# Patient Record
Sex: Female | Born: 1970 | Race: White | Hispanic: No | Marital: Married | Smoking: Former smoker
Health system: Southern US, Community
[De-identification: ages and names within clinical notes are randomized; demographics above are authoritative.]

## PROBLEM LIST (undated history)

## (undated) DIAGNOSIS — K760 Fatty (change of) liver, not elsewhere classified: Secondary | ICD-10-CM

## (undated) DIAGNOSIS — Z9889 Other specified postprocedural states: Secondary | ICD-10-CM

## (undated) DIAGNOSIS — K219 Gastro-esophageal reflux disease without esophagitis: Secondary | ICD-10-CM

## (undated) DIAGNOSIS — E7800 Pure hypercholesterolemia, unspecified: Secondary | ICD-10-CM

## (undated) DIAGNOSIS — F411 Generalized anxiety disorder: Secondary | ICD-10-CM

## (undated) DIAGNOSIS — K449 Diaphragmatic hernia without obstruction or gangrene: Secondary | ICD-10-CM

## (undated) HISTORY — DX: Gastro-esophageal reflux disease without esophagitis: K21.9

## (undated) HISTORY — DX: Diaphragmatic hernia without obstruction or gangrene: K44.9

## (undated) HISTORY — PX: TONSILLECTOMY: SUR1361

## (undated) HISTORY — DX: Fatty (change of) liver, not elsewhere classified: K76.0

## (undated) HISTORY — DX: Generalized anxiety disorder: F41.1

## (undated) HISTORY — PX: APPENDECTOMY: SHX54

## (undated) HISTORY — DX: Pure hypercholesterolemia, unspecified: E78.00

## (undated) HISTORY — PX: OTHER SURGICAL HISTORY: SHX169

---

## 2016-07-27 ENCOUNTER — Other Ambulatory Visit: Payer: Self-pay | Admitting: Obstetrics and Gynecology

## 2016-07-27 DIAGNOSIS — Z803 Family history of malignant neoplasm of breast: Secondary | ICD-10-CM

## 2016-07-27 DIAGNOSIS — R928 Other abnormal and inconclusive findings on diagnostic imaging of breast: Secondary | ICD-10-CM

## 2016-08-07 ENCOUNTER — Other Ambulatory Visit: Payer: Self-pay

## 2017-04-23 ENCOUNTER — Encounter (INDEPENDENT_AMBULATORY_CARE_PROVIDER_SITE_OTHER): Payer: Self-pay | Admitting: Internal Medicine

## 2017-04-23 ENCOUNTER — Encounter (INDEPENDENT_AMBULATORY_CARE_PROVIDER_SITE_OTHER): Payer: Self-pay

## 2017-05-19 ENCOUNTER — Ambulatory Visit (INDEPENDENT_AMBULATORY_CARE_PROVIDER_SITE_OTHER): Payer: Self-pay | Admitting: Internal Medicine

## 2017-06-03 ENCOUNTER — Ambulatory Visit (INDEPENDENT_AMBULATORY_CARE_PROVIDER_SITE_OTHER): Payer: PRIVATE HEALTH INSURANCE | Admitting: Internal Medicine

## 2017-06-03 ENCOUNTER — Encounter (INDEPENDENT_AMBULATORY_CARE_PROVIDER_SITE_OTHER): Payer: Self-pay | Admitting: *Deleted

## 2017-06-03 ENCOUNTER — Encounter (INDEPENDENT_AMBULATORY_CARE_PROVIDER_SITE_OTHER): Payer: Self-pay | Admitting: Internal Medicine

## 2017-06-03 VITALS — BP 142/80 | HR 72 | Temp 99.0°F | Ht 65.0 in | Wt 171.7 lb

## 2017-06-03 DIAGNOSIS — K760 Fatty (change of) liver, not elsewhere classified: Secondary | ICD-10-CM | POA: Insufficient documentation

## 2017-06-03 DIAGNOSIS — F411 Generalized anxiety disorder: Secondary | ICD-10-CM

## 2017-06-03 DIAGNOSIS — K581 Irritable bowel syndrome with constipation: Secondary | ICD-10-CM

## 2017-06-03 DIAGNOSIS — E7800 Pure hypercholesterolemia, unspecified: Secondary | ICD-10-CM

## 2017-06-03 DIAGNOSIS — K449 Diaphragmatic hernia without obstruction or gangrene: Secondary | ICD-10-CM | POA: Insufficient documentation

## 2017-06-03 DIAGNOSIS — K219 Gastro-esophageal reflux disease without esophagitis: Secondary | ICD-10-CM

## 2017-06-03 HISTORY — DX: Gastro-esophageal reflux disease without esophagitis: K21.9

## 2017-06-03 HISTORY — DX: Generalized anxiety disorder: F41.1

## 2017-06-03 HISTORY — DX: Fatty (change of) liver, not elsewhere classified: K76.0

## 2017-06-03 HISTORY — DX: Pure hypercholesterolemia, unspecified: E78.00

## 2017-06-03 HISTORY — DX: Diaphragmatic hernia without obstruction or gangrene: K44.9

## 2017-06-03 LAB — COMPREHENSIVE METABOLIC PANEL
AG RATIO: 1.7 (calc) (ref 1.0–2.5)
ALKALINE PHOSPHATASE (APISO): 78 U/L (ref 33–115)
ALT: 65 U/L — ABNORMAL HIGH (ref 6–29)
AST: 51 U/L — AB (ref 10–35)
Albumin: 4.3 g/dL (ref 3.6–5.1)
BILIRUBIN TOTAL: 0.4 mg/dL (ref 0.2–1.2)
BUN: 11 mg/dL (ref 7–25)
CHLORIDE: 105 mmol/L (ref 98–110)
CO2: 27 mmol/L (ref 20–32)
Calcium: 9.2 mg/dL (ref 8.6–10.2)
Creat: 0.81 mg/dL (ref 0.50–1.10)
GLOBULIN: 2.5 g/dL (ref 1.9–3.7)
Glucose, Bld: 125 mg/dL — ABNORMAL HIGH (ref 65–99)
Potassium: 4.3 mmol/L (ref 3.5–5.3)
Sodium: 140 mmol/L (ref 135–146)
Total Protein: 6.8 g/dL (ref 6.1–8.1)

## 2017-06-03 MED ORDER — DICYCLOMINE HCL 10 MG PO CAPS: 10.0000 mg | ORAL_CAPSULE | Freq: Two times a day (BID) | ORAL | 3 refills | Status: DC

## 2017-06-03 NOTE — Progress Notes (Addendum)
   Subjective:    Patient ID: Lynn Mcdaniel, female    DOB: 1971-05-29, 46 y.o.   MRN: [id number]  HPI Referred by Marlena Clipper NP for IBS/constipaton. Hx of same. She has bloating and constipation off and on.  She has random lower abdominal pain. No foods in particular bother her. She has the pain 4-5 times a month. The pain will last a day or two off and on. She says the pain feels like there is gas that is not moving. Lying on her stomach gives her relief.  She has a BM once a day and she has to strain.  She does not take a stool softener or laxative for this.  Her appetite is good. No weight loss. She has tried Bentyl which did not help.  Hx of fatty liver.  She says Acute Hepatitis panel in the past has been negative. 02/20/2016 [place] RUQ: Rt upper quadrant pain Fatty liver, otherwise normal.  No past hx of IV drugs.  One tattoo.   Hx of blood transfusion in 1998 Her last colonoscopy was in 2015 by Dr. Aleene Davidson (Irritable Bowel) (Normal).   03/16/2017 EGD: abdominal pain, hiatal hernia, duodenitis: Normal gastric mucosa from cardia to second portion of duodenum. Good gastri peristalsis.  4cm hiatus hernia: Negative for Celiac disease.   Review of Systems Past Medical History:  Diagnosis Date  . Anxiety state 06/03/2017  . Fatty liver 06/03/2017  . GERD (gastroesophageal reflux disease) 06/03/2017  . Hiatal hernia 06/03/2017  . High cholesterol 06/03/2017    Past Surgical History:  Procedure Laterality Date  . APPENDECTOMY    . CESAREAN SECTION    . partial hysterectomy    . TONSILLECTOMY      No Known Allergies  No current outpatient prescriptions on file prior to visit.   No current facility-administered medications on file prior to visit.         Objective:   Physical Exam Blood pressure (!) 142/80, pulse 72, temperature 99 F (37.2 C), height  (1.651 m), weight 171 lb 11.2 oz (77.9 kg).  Alert and oriented. Skin warm and dry. Oral mucosa is moist.   .  Sclera anicteric, conjunctivae is pink. Thyroid not enlarged. No cervical lymphadenopathy. Lungs clear. Heart regular rate and rhythm.  Abdomen is soft. Bowel sounds are positive. No hepatomegaly. No abdominal masses felt. No tenderness.  No edema to lower extremities.         Assessment & Plan:  Constipation. Am going to try her on Amitiza  BID She will try the Bentyl she has at home.  Hx of fatty liver: Will get CMET, [place] Elastrography.  OV in 3 months.

## 2017-06-03 NOTE — Patient Instructions (Signed)
Sample of Amitiza given to patient.  Rx for Dicyclomine  OV in 3 months.

## 2017-06-16 ENCOUNTER — Ambulatory Visit (HOSPITAL_COMMUNITY)
Admission: RE | Admit: 2017-06-16 | Discharge: 2017-06-16 | Disposition: A | Discharge status: Home / Self Care | Payer: PRIVATE HEALTH INSURANCE | Source: Ambulatory Visit | Attending: Internal Medicine | Admitting: Internal Medicine

## 2017-06-16 DIAGNOSIS — K760 Fatty (change of) liver, not elsewhere classified: Secondary | ICD-10-CM | POA: Diagnosis not present

## 2018-01-25 ENCOUNTER — Other Ambulatory Visit (INDEPENDENT_AMBULATORY_CARE_PROVIDER_SITE_OTHER): Payer: Self-pay | Admitting: Internal Medicine

## 2019-08-05 IMAGING — US US ABDOMEN COMPLETE W/ ELASTOGRAPHY
2 series · 13 of 25 positions shown · non-contrast
Comparison: None.

CLINICAL DATA: Fatty liver



[Series 1: us abdomen complete w/ elastography · 0.19mm/px · 10 of 91 slices shown (1 of 2)]
[im 1/91]
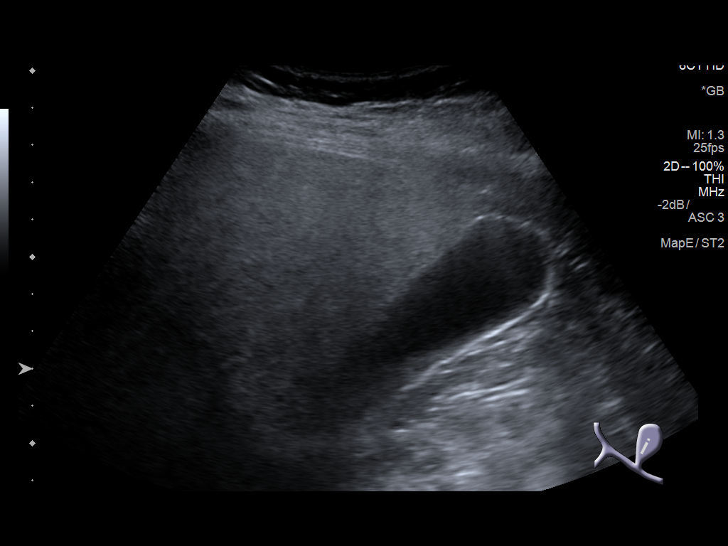
[im 10/91]
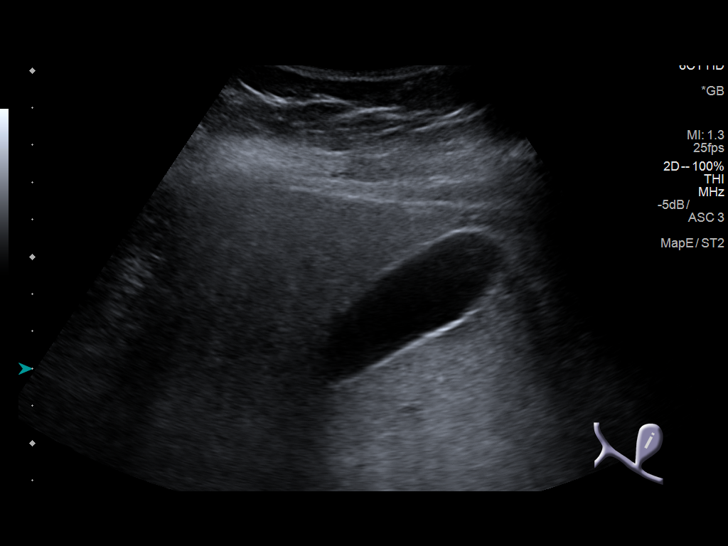
[im 19/91]
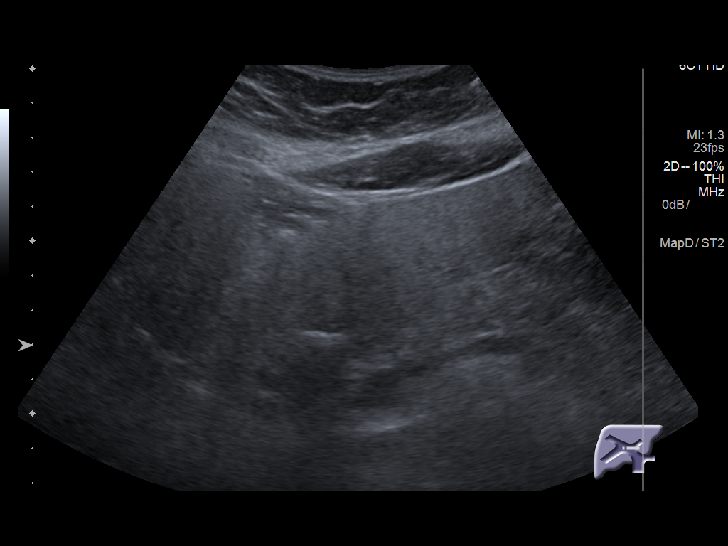
[im 29/91]
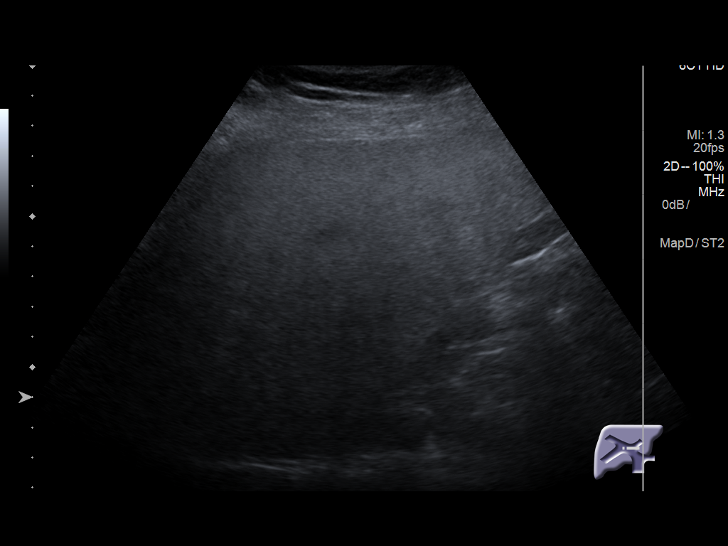
[im 38/91]
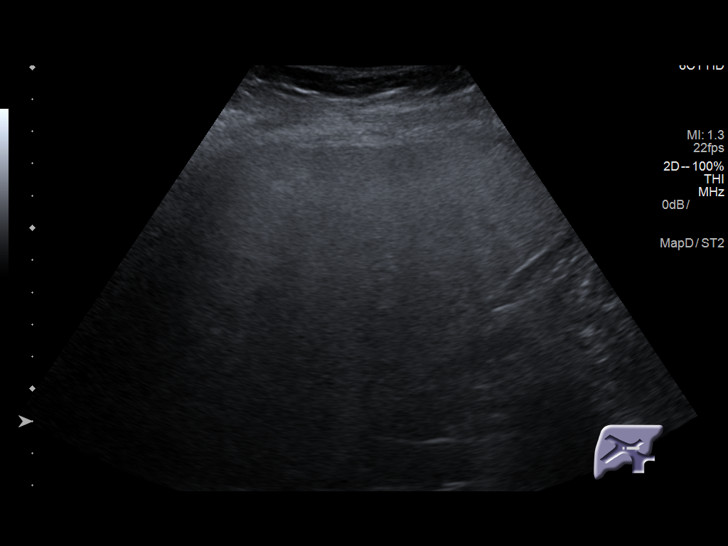
[im 48/91]
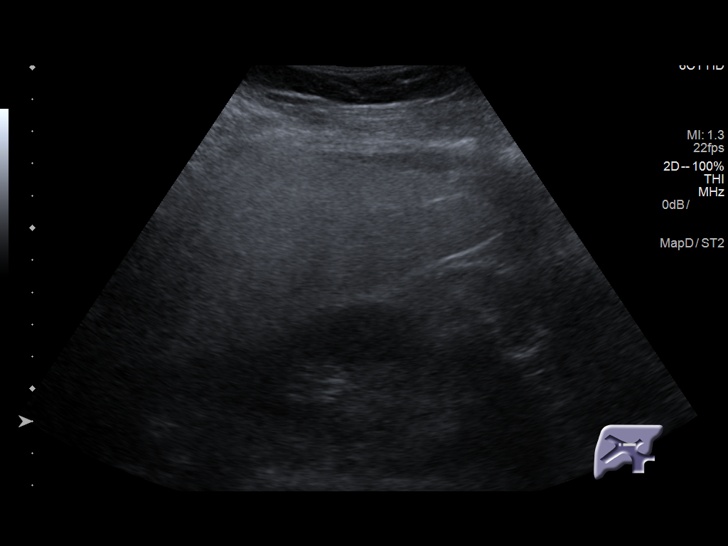
[im 57/91]
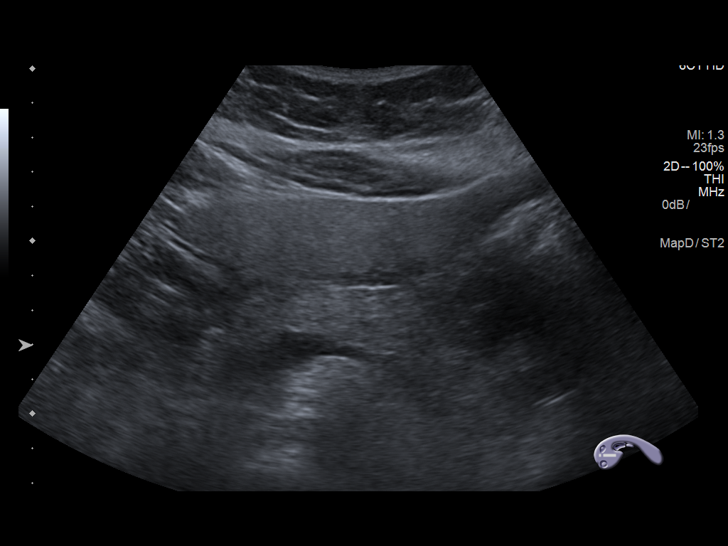
[im 67/91]
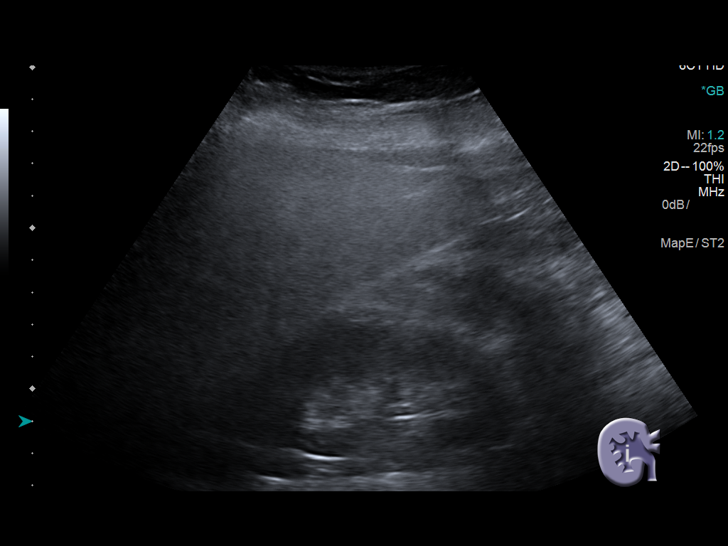
[im 76/91]
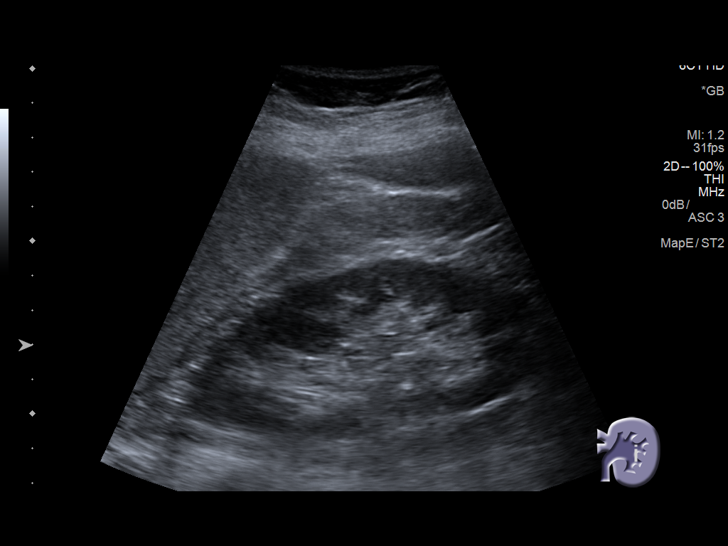
[im 86/91]
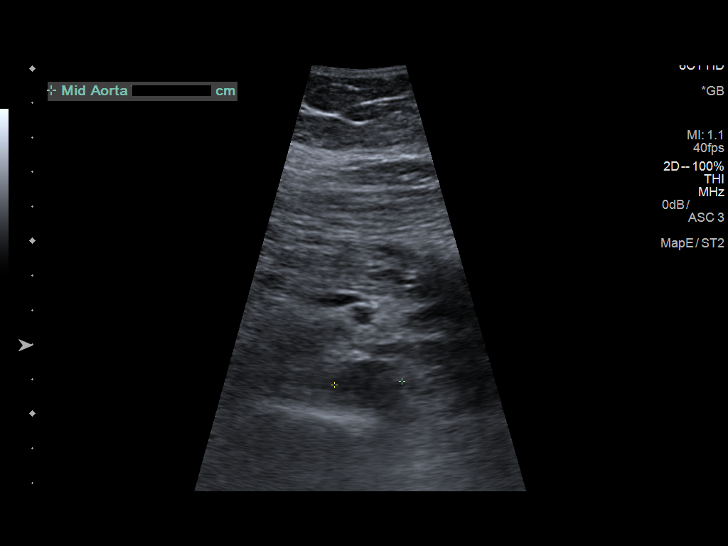

[Series 2001: us abdomen complete w/ elastography · 0.16mm/px · 3 of 20 slices shown (2 of 2)]
[im 1/20]
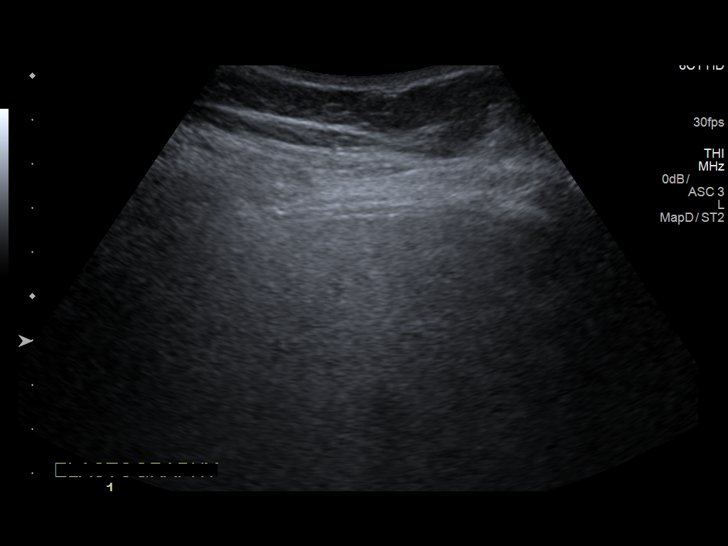
[im 10/20]
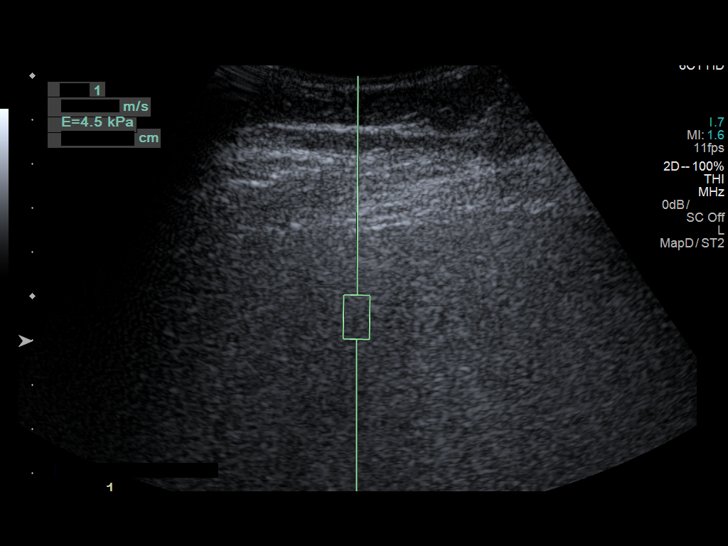
[im 20/20]
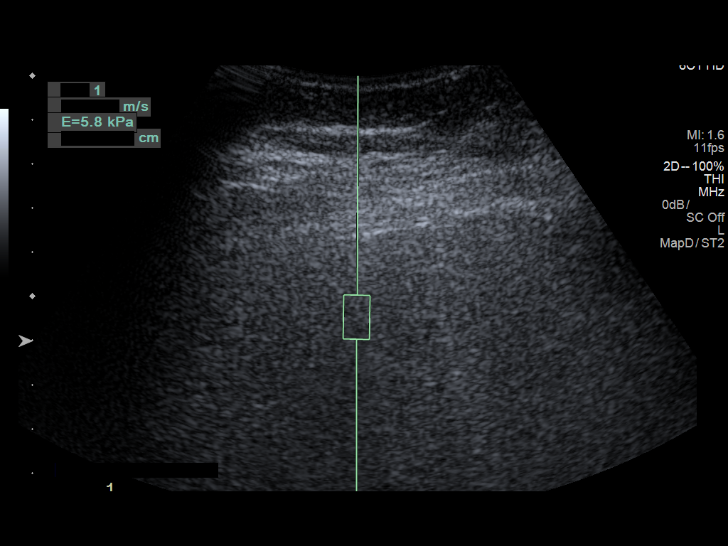

[13 of 25 positions shown; findings below may reference images not displayed]

FINDINGS: ULTRASOUND ABDOMEN

Gallbladder: No gallstones or wall thickening visualized. No
sonographic Murphy sign noted by sonographer.

Common bile duct: Diameter: Normal caliber, 3 mm

Liver: Increased echotexture compatible with fatty infiltration. No
focal abnormality or biliary ductal dilatation. Portal vein is
patent on color Doppler imaging with normal direction of blood flow
towards the liver.

IVC: No abnormality visualized.

Pancreas: Visualized portion unremarkable.

Spleen: Size and appearance within normal limits.

Right Kidney: Length: 9.3 cm. Echogenicity within normal limits. No
mass or hydronephrosis visualized.

Left Kidney: Length: 9.0 cm. Echogenicity within normal limits. No
mass or hydronephrosis visualized.

Abdominal aorta: No aneurysm visualized.

Other findings: None.

ULTRASOUND HEPATIC ELASTOGRAPHY

Device: Siemens Helix VTQ

Patient position: Left lateral decubitus

Transducer 6C1

Number of measurements: 10

Hepatic segment:  8

Median velocity:   1.14  m/sec

IQR:

IQR/Median velocity ratio:

Corresponding Metavir fibrosis score:  F0/F1

Risk of fibrosis: Minimal

Limitations of exam: None

Pertinent findings noted on other imaging exams:  None

Please note that abnormal shear wave velocities may also be
identified in clinical settings other than with hepatic fibrosis,
such as: acute hepatitis, elevated right heart and central venous
pressures including use of beta blockers, Edouard disease
(Aqu[place]), infiltrative processes such as
mastocytosis/amyloidosis/infiltrative tumor, extrahepatic
cholestasis, in the post-prandial state, and liver transplantation.
Correlation with patient history, laboratory data, and clinical
condition recommended.
IMPRESSION: ULTRASOUND ABDOMEN:
Fatty infiltration of the liver.

ULTRASOUND HEPATIC ELASTOGRAPHY:

Median hepatic shear wave velocity is calculated at 1.14 m/sec.

Corresponding Metavir fibrosis score is  F0/F1.

Risk of fibrosis is Minimal.

Follow-up: None required

## 2023-06-07 ENCOUNTER — Encounter (INDEPENDENT_AMBULATORY_CARE_PROVIDER_SITE_OTHER): Payer: Self-pay | Admitting: *Deleted

## 2023-06-14 ENCOUNTER — Ambulatory Visit (INDEPENDENT_AMBULATORY_CARE_PROVIDER_SITE_OTHER): Payer: BC Managed Care – PPO | Admitting: Gastroenterology

## 2023-06-14 ENCOUNTER — Encounter (INDEPENDENT_AMBULATORY_CARE_PROVIDER_SITE_OTHER): Payer: Self-pay | Admitting: Gastroenterology

## 2023-06-14 VITALS — BP 118/71 | HR 50 | Temp 98.1°F | Ht 66.0 in | Wt 166.0 lb

## 2023-06-14 DIAGNOSIS — R1013 Epigastric pain: Secondary | ICD-10-CM | POA: Diagnosis not present

## 2023-06-14 DIAGNOSIS — K760 Fatty (change of) liver, not elsewhere classified: Secondary | ICD-10-CM

## 2023-06-14 DIAGNOSIS — R1319 Other dysphagia: Secondary | ICD-10-CM

## 2023-06-14 DIAGNOSIS — R1310 Dysphagia, unspecified: Secondary | ICD-10-CM | POA: Diagnosis not present

## 2023-06-14 DIAGNOSIS — Z1211 Encounter for screening for malignant neoplasm of colon: Secondary | ICD-10-CM

## 2023-06-14 NOTE — Progress Notes (Signed)
Vista Lawman , M.D. Gastroenterology & Hepatology [facility] Gastroenterology [address] [place], [place] [address] Primary Care Physician: Aggie Cosier, MD [address] [place] [address]  Chief Complaint:  Right upper quadrant pain , Fatty liver disease , Colon cancer screening   History of Present Illness: Lynn Mcdaniel is a 52 y.o. female with HTM , preDM, HLD  who presents for evaluation of   Right upper quadrant pain , Fatty liver disease and Colon cancer screening   Patient reports right upper quadrant pain which is intermittent in nature, she has not noticed any aggravating or relieving factor, pain nature does not change with food neither with defecation.  Patient does report alternating bowel movements with sometimes straining.  But this does not her main issue.  Patient is concerned more about fatty liver which may lead to fibrosis and cirrhosis.  She has been opting for healthy lifestyle changes and trying to lose weight The patient denies having any nausea, vomiting, fever, chills, hematochezia, melena, hematemesis, abdominal distention, abdominal pain, diarrhea, jaundice, pruritus or weight loss.  Last EGD:10 years ago at [facility] Last Colonoscopy:10 years ago at [facility]  FHx: neg for any gastrointestinal/liver disease, no malignancies Social: neg smoking, alcohol or illicit drug use Surgical: Hysterectomy  Past Medical History: Past Medical History:  Diagnosis Date   Anxiety state 06/03/2017   Fatty liver 06/03/2017   GERD (gastroesophageal reflux disease) 06/03/2017   Hiatal hernia 06/03/2017   High cholesterol 06/03/2017   Family History:No family history on file.  Social History: Social History   Tobacco Use  Smoking Status Former   Passive exposure: Past  Smokeless Tobacco Never  Tobacco Comments   quit 10 yrs ago after smoking for 20 yrs. 1 pack a day.    Social History   Substance and Sexual  Activity  Alcohol Use No   Social History   Substance and Sexual Activity  Drug Use No    Allergies: No Known Allergies  Medications: Current Outpatient Medications  Medication Sig Dispense Refill   ALPRAZolam (XANAX) 0.5 MG tablet Take 0.5 mg by mouth at bedtime as needed for anxiety.     Calcium Carbonate-Vit D-Min (CALCIUM 1200 PO) Take by mouth.     cholecalciferol (VITAMIN D) 1000 units tablet Take 4,000 Units by mouth daily.     losartan (COZAAR) 50 MG tablet Take 50 mg by mouth daily.     nebivolol (BYSTOLIC) 5 MG tablet Take 5 mg by mouth daily.     omeprazole (PRILOSEC) 40 MG capsule Take 40 mg by mouth daily.     OVER THE COUNTER MEDICATION Fish oil 2 per day     sertraline (ZOLOFT) 100 MG tablet Take 100 mg by mouth daily.     simvastatin (ZOCOR) 40 MG tablet Take 40 mg by mouth daily.     No current facility-administered medications for this visit.    Review of Systems: GENERAL: negative for malaise, night sweats HEENT: No changes in hearing or vision, no nose bleeds or other nasal problems. NECK: Negative for lumps, goiter, pain and significant neck swelling RESPIRATORY: Negative for cough, wheezing CARDIOVASCULAR: Negative for chest pain, leg swelling, palpitations, orthopnea GI: SEE HPI MUSCULOSKELETAL: Negative for joint pain or swelling, back pain, and muscle pain. SKIN: Negative for lesions, rash HEMATOLOGY Negative for prolonged bleeding, bruising easily, and swollen nodes. ENDOCRINE: Negative for cold or heat intolerance, polyuria, polydipsia and goiter. NEURO: negative for tremor, gait imbalance, syncope and  seizures. The remainder of the review of systems is noncontributory.   Physical Exam: BP 118/71 (BP Location: Left Arm, Patient Position: Sitting, Cuff Size: Normal)   Pulse (!) 50   Temp 98.1 F (36.7 C) (Oral)   Ht 5\' 6"  (1.676 m)   Wt 166 lb (75.3 kg)   BMI 26.79 kg/m  GENERAL: The patient is AO x3, in no acute distress. HEENT: Head is  normocephalic and atraumatic. EOMI are intact. Mouth is well hydrated and without lesions. NECK: Supple. No masses LUNGS: Clear to auscultation. No presence of rhonchi/wheezing/rales. Adequate chest expansion HEART: RRR, normal s1 and s2. ABDOMEN: Soft, nontender, no guarding, no peritoneal signs, and nondistended. BS +. No masses. EXTREMITIES: Without any cyanosis, clubbing, rash, lesions or edema. NEUROLOGIC: AOx3, no focal motor deficit. SKIN: no jaundice, no rashes   Imaging/Labs: as above  ULTRASOUND ABDOMEN: Fatty infiltration of the liver.   ULTRASOUND HEPATIC ELASTOGRAPHY:   Median hepatic shear wave velocity is calculated at 1.14 m/sec.   Corresponding Metavir fibrosis score is  F0/F1.   Risk of fibrosis is Minimal.   I personally reviewed and interpreted the available labs, imaging and endoscopic files.  Impression and Plan:   Lynn Mcdaniel is a 52 y.o. female with HTM , preDM, HLD  who presents for evaluation of   Right upper quadrant pain , Fatty liver disease and Colon cancer screening   #Right upper quadrant pain  #Fatty liver disease   This could be peptic ulcer disease or symptomatic biliary/gallbladder disease or irritable bowel syndrome or sphincter of Oddi dysfunction  Fatty liver disease is usually painless in nature  Persistent right upper quadrant pain although no aggravating or relieving factor.   Abdominal exam with epigastric and right upper quadrant mild tenderness  Patient has fatty liver disease since over 5 years and's concern for progression to fibrosis or cirrhosis.  Risk factor for MASLD are BMI above 25, prediabetes, hypertension and hyperlipidemia (metabolic syndrome)  I do not see any recent liver enzyme in Care Everywhere or [employer] portal   Will proceed with obtaining a right upper quadrant sono with elastography to assess for any degree of fibrosis.  Also will order baseline viral hepatitis profile and liver  enzymes  Further recommendation for fatty liver disease:      - walking at a brisk pace/biking at moderate intesity 2.5-5 hours per week     - use pedometer/step counter to track activity     - goal to lose 5-10% of initial body weight     - avoid suagry drinks and juices, use zero calorie beverages     - increase water intake     - eat a low carb diet with plenty of veggies and fruit     - Get sufficient sleep 7-8 hrs nightly     - maitain active lifestyle     - avoid alcohol     - recommend 2-3 cups Coffee daily     - Counsel on lowering cholesterol by having a diet rich in vegetables,          protein (avoid red meats) and good fats(fish, salmon).  If upper endoscopy and above workup unremarkable patient may need HIDA scan+/- referral to surgery for possible gallbladder disease  #Dysphagia   Patient reports intermittent solid food dysphagia and with persistent abdominal pain we will ruled out peptic ulcer disease and any stricture , webs or rings with diagnostic upper endoscopy +/- esophageal biopsy/dilation  #Colon cancer  screening  Last colonoscopy 10 years ago at [facility]  The patient was counseled regarding the importance of colorectal cancer screening. The benefits of screening include early detection of colorectal cancer and precancerous polyps, which can improve treatment outcomes and reduce mortality. Risks associated with screening, particularly colonoscopy, include potential complications such as bleeding and perforation. After deciding different modalities for screening for colon cancer , patient has opted to pursue Colonoscopy   All questions were answered.      Vista Lawman, MD Gastroenterology and Hepatology [facility] Gastroenterology   This chart has been completed using [facility] Dictation software, and while attempts have been made to ensure accuracy , certain words and phrases may not be transcribed as intended

## 2023-06-14 NOTE — Patient Instructions (Signed)
It was very nice to meet you today, as dicussed with will plan for the following :  1) Upper endoscopy and colonoscopy  2) blood work and ultrasound

## 2023-06-14 NOTE — H&P (View-Only) (Signed)
Vista Lawman , M.D. Gastroenterology & Hepatology [facility] Gastroenterology [address] [place], [place] [address] Primary Care Physician: Aggie Cosier, MD [address] [place] [address]  Chief Complaint:  Right upper quadrant pain , Fatty liver disease , Colon cancer screening   History of Present Illness: Lynn Mcdaniel is a 52 y.o. female with HTM , preDM, HLD  who presents for evaluation of   Right upper quadrant pain , Fatty liver disease and Colon cancer screening   Patient reports right upper quadrant pain which is intermittent in nature, she has not noticed any aggravating or relieving factor, pain nature does not change with food neither with defecation.  Patient does report alternating bowel movements with sometimes straining.  But this does not her main issue.  Patient is concerned more about fatty liver which may lead to fibrosis and cirrhosis.  She has been opting for healthy lifestyle changes and trying to lose weight The patient denies having any nausea, vomiting, fever, chills, hematochezia, melena, hematemesis, abdominal distention, abdominal pain, diarrhea, jaundice, pruritus or weight loss.  Last EGD:10 years ago at [facility] Last Colonoscopy:10 years ago at [facility]  FHx: neg for any gastrointestinal/liver disease, no malignancies Social: neg smoking, alcohol or illicit drug use Surgical: Hysterectomy  Past Medical History: Past Medical History:  Diagnosis Date   Anxiety state 06/03/2017   Fatty liver 06/03/2017   GERD (gastroesophageal reflux disease) 06/03/2017   Hiatal hernia 06/03/2017   High cholesterol 06/03/2017   Family History:No family history on file.  Social History: Social History   Tobacco Use  Smoking Status Former   Passive exposure: Past  Smokeless Tobacco Never  Tobacco Comments   quit 10 yrs ago after smoking for 20 yrs. 1 pack a day.    Social History   Substance and Sexual  Activity  Alcohol Use No   Social History   Substance and Sexual Activity  Drug Use No    Allergies: No Known Allergies  Medications: Current Outpatient Medications  Medication Sig Dispense Refill   ALPRAZolam (XANAX) 0.5 MG tablet Take 0.5 mg by mouth at bedtime as needed for anxiety.     Calcium Carbonate-Vit D-Min (CALCIUM 1200 PO) Take by mouth.     cholecalciferol (VITAMIN D) 1000 units tablet Take 4,000 Units by mouth daily.     losartan (COZAAR) 50 MG tablet Take 50 mg by mouth daily.     nebivolol (BYSTOLIC) 5 MG tablet Take 5 mg by mouth daily.     omeprazole (PRILOSEC) 40 MG capsule Take 40 mg by mouth daily.     OVER THE COUNTER MEDICATION Fish oil 2 per day     sertraline (ZOLOFT) 100 MG tablet Take 100 mg by mouth daily.     simvastatin (ZOCOR) 40 MG tablet Take 40 mg by mouth daily.     No current facility-administered medications for this visit.    Review of Systems: GENERAL: negative for malaise, night sweats HEENT: No changes in hearing or vision, no nose bleeds or other nasal problems. NECK: Negative for lumps, goiter, pain and significant neck swelling RESPIRATORY: Negative for cough, wheezing CARDIOVASCULAR: Negative for chest pain, leg swelling, palpitations, orthopnea GI: SEE HPI MUSCULOSKELETAL: Negative for joint pain or swelling, back pain, and muscle pain. SKIN: Negative for lesions, rash HEMATOLOGY Negative for prolonged bleeding, bruising easily, and swollen nodes. ENDOCRINE: Negative for cold or heat intolerance, polyuria, polydipsia and goiter. NEURO: negative for tremor, gait imbalance, syncope and  seizures. The remainder of the review of systems is noncontributory.   Physical Exam: BP 118/71 (BP Location: Left Arm, Patient Position: Sitting, Cuff Size: Normal)   Pulse (!) 50   Temp 98.1 F (36.7 C) (Oral)   Ht 5\' 6"  (1.676 m)   Wt 166 lb (75.3 kg)   BMI 26.79 kg/m  GENERAL: The patient is AO x3, in no acute distress. HEENT: Head is  normocephalic and atraumatic. EOMI are intact. Mouth is well hydrated and without lesions. NECK: Supple. No masses LUNGS: Clear to auscultation. No presence of rhonchi/wheezing/rales. Adequate chest expansion HEART: RRR, normal s1 and s2. ABDOMEN: Soft, nontender, no guarding, no peritoneal signs, and nondistended. BS +. No masses. EXTREMITIES: Without any cyanosis, clubbing, rash, lesions or edema. NEUROLOGIC: AOx3, no focal motor deficit. SKIN: no jaundice, no rashes   Imaging/Labs: as above  ULTRASOUND ABDOMEN: Fatty infiltration of the liver.   ULTRASOUND HEPATIC ELASTOGRAPHY:   Median hepatic shear wave velocity is calculated at 1.14 m/sec.   Corresponding Metavir fibrosis score is  F0/F1.   Risk of fibrosis is Minimal.   I personally reviewed and interpreted the available labs, imaging and endoscopic files.  Impression and Plan:   Lynn Mcdaniel is a 52 y.o. female with HTM , preDM, HLD  who presents for evaluation of   Right upper quadrant pain , Fatty liver disease and Colon cancer screening   #Right upper quadrant pain  #Fatty liver disease   This could be peptic ulcer disease or symptomatic biliary/gallbladder disease or irritable bowel syndrome or sphincter of Oddi dysfunction  Fatty liver disease is usually painless in nature  Persistent right upper quadrant pain although no aggravating or relieving factor.   Abdominal exam with epigastric and right upper quadrant mild tenderness  Patient has fatty liver disease since over 5 years and's concern for progression to fibrosis or cirrhosis.  Risk factor for MASLD are BMI above 25, prediabetes, hypertension and hyperlipidemia (metabolic syndrome)  I do not see any recent liver enzyme in Care Everywhere or [employer] portal   Will proceed with obtaining a right upper quadrant sono with elastography to assess for any degree of fibrosis.  Also will order baseline viral hepatitis profile and liver  enzymes  Further recommendation for fatty liver disease:      - walking at a brisk pace/biking at moderate intesity 2.5-5 hours per week     - use pedometer/step counter to track activity     - goal to lose 5-10% of initial body weight     - avoid suagry drinks and juices, use zero calorie beverages     - increase water intake     - eat a low carb diet with plenty of veggies and fruit     - Get sufficient sleep 7-8 hrs nightly     - maitain active lifestyle     - avoid alcohol     - recommend 2-3 cups Coffee daily     - Counsel on lowering cholesterol by having a diet rich in vegetables,          protein (avoid red meats) and good fats(fish, salmon).  If upper endoscopy and above workup unremarkable patient may need HIDA scan+/- referral to surgery for possible gallbladder disease  #Dysphagia   Patient reports intermittent solid food dysphagia and with persistent abdominal pain we will ruled out peptic ulcer disease and any stricture , webs or rings with diagnostic upper endoscopy +/- esophageal biopsy/dilation  #Colon cancer  screening  Last colonoscopy 10 years ago at [facility]  The patient was counseled regarding the importance of colorectal cancer screening. The benefits of screening include early detection of colorectal cancer and precancerous polyps, which can improve treatment outcomes and reduce mortality. Risks associated with screening, particularly colonoscopy, include potential complications such as bleeding and perforation. After deciding different modalities for screening for colon cancer , patient has opted to pursue Colonoscopy   All questions were answered.      Vista Lawman, MD Gastroenterology and Hepatology [facility] Gastroenterology   This chart has been completed using [facility] Dictation software, and while attempts have been made to ensure accuracy , certain words and phrases may not be transcribed as intended

## 2023-06-15 LAB — COMPREHENSIVE METABOLIC PANEL
ALT: 22 [IU]/L (ref 0–32)
AST: 22 [IU]/L (ref 0–40)
Albumin: 4.5 g/dL (ref 3.8–4.9)
Alkaline Phosphatase: 97 [IU]/L (ref 44–121)
BUN/Creatinine Ratio: 18 (ref 9–23)
BUN: 16 mg/dL (ref 6–24)
Bilirubin Total: 0.3 mg/dL (ref 0.0–1.2)
CO2: 23 mmol/L (ref 20–29)
Calcium: 9.2 mg/dL (ref 8.7–10.2)
Chloride: 107 mmol/L — ABNORMAL HIGH (ref 96–106)
Creatinine, Ser: 0.87 mg/dL (ref 0.57–1.00)
Globulin, Total: 1.9 g/dL (ref 1.5–4.5)
Glucose: 97 mg/dL (ref 70–99)
Potassium: 3.8 mmol/L (ref 3.5–5.2)
Sodium: 144 mmol/L (ref 134–144)
Total Protein: 6.4 g/dL (ref 6.0–8.5)
eGFR: 80 mL/min/{1.73_m2} (ref >59)

## 2023-06-15 LAB — HEPATITIS B SURFACE ANTIGEN: Hepatitis B Surface Ag: NEGATIVE

## 2023-06-15 LAB — HEPATITIS C ANTIBODY: Hep C Virus Ab: NONREACTIVE

## 2023-06-15 LAB — HEPATITIS B SURFACE ANTIBODY,QUALITATIVE: Hep B Surface Ab, Qual: NONREACTIVE

## 2023-06-15 LAB — HEPATITIS B CORE ANTIBODY, TOTAL: Hep B Core Total Ab: NEGATIVE

## 2023-06-15 LAB — HEPATITIS A ANTIBODY, TOTAL: hep A Total Ab: NEGATIVE

## 2023-06-16 NOTE — Progress Notes (Signed)
Hi Toniann Fail ,  Can you please call the patient and tell the patient the lab work shows normal liver enzymes, this is a good news  Recommend vaccination against hepatitis A and B to follow-up with PCP  Also will follow-up your ultrasound  Thanks,  Vista Lawman, MD Gastroenterology and Hepatology [facility] Gastroenterology  ===============  Normal AST ALT and alk phos T. bili 0.3 Hep A nonimmune hep B nonexposed nonimmune hep C negative

## 2023-06-17 ENCOUNTER — Telehealth (INDEPENDENT_AMBULATORY_CARE_PROVIDER_SITE_OTHER): Payer: Self-pay | Admitting: Gastroenterology

## 2023-06-17 NOTE — Telephone Encounter (Signed)
Left message for pt to return call. Need to schedule TCS/EGD. [place] scheduled for 06/25/23 at 10:30 am [facility]. NPO after midnight. Arrive 15 minutes early.

## 2023-06-18 MED ORDER — PEG 3350-KCL-NA BICARB-NACL 420 G PO SOLR: 4000.0000 mL | Freq: Once | ORAL | 0 refills | Status: AC

## 2023-06-18 NOTE — Telephone Encounter (Signed)
Pt left voicemail to call her at work to schedule. Contacted pt and scheduled TCS/EGD for 07/13/23 at 10:30am. Instructions sent via my chart. Prep sent to pharmacy.

## 2023-06-18 NOTE — Addendum Note (Signed)
Addended by: Marlowe Shores on: 06/18/2023 08:38 AM   Modules accepted: Orders

## 2023-06-25 ENCOUNTER — Ambulatory Visit (HOSPITAL_COMMUNITY)
Admission: RE | Admit: 2023-06-25 | Discharge: 2023-06-25 | Disposition: A | Discharge status: Home / Self Care | Payer: BC Managed Care – PPO | Source: Ambulatory Visit | Attending: Gastroenterology | Admitting: Gastroenterology

## 2023-06-25 DIAGNOSIS — K760 Fatty (change of) liver, not elsewhere classified: Secondary | ICD-10-CM | POA: Insufficient documentation

## 2023-07-08 ENCOUNTER — Telehealth (INDEPENDENT_AMBULATORY_CARE_PROVIDER_SITE_OTHER): Payer: Self-pay | Admitting: *Deleted

## 2023-07-08 NOTE — Telephone Encounter (Signed)
Pt called to get results of ultrasound. I called her back and let her know it has not been read yet by radiologist. She had done on 10/25. I told her it was taking about 2 weeks lately to get report finalized and I would check it tomorrow.

## 2023-07-09 NOTE — Telephone Encounter (Signed)
Still pending. Pt notified on vm [place] still pending. And I would follow up on Monday.

## 2023-07-12 NOTE — Telephone Encounter (Signed)
Pt called for results of scan

## 2023-07-13 ENCOUNTER — Ambulatory Visit (HOSPITAL_COMMUNITY): Payer: BC Managed Care – PPO | Admitting: Certified Registered"

## 2023-07-13 ENCOUNTER — Encounter (HOSPITAL_COMMUNITY)
Admission: RE | Disposition: A | Discharge status: Home / Self Care | Payer: Self-pay | Source: Home / Self Care | Attending: Gastroenterology

## 2023-07-13 ENCOUNTER — Ambulatory Visit (HOSPITAL_COMMUNITY)
Admission: RE | Admit: 2023-07-13 | Discharge: 2023-07-13 | Disposition: A | Discharge status: Home / Self Care | Payer: BC Managed Care – PPO | Attending: Gastroenterology | Admitting: Gastroenterology

## 2023-07-13 ENCOUNTER — Encounter (HOSPITAL_COMMUNITY): Payer: Self-pay

## 2023-07-13 ENCOUNTER — Other Ambulatory Visit: Payer: Self-pay

## 2023-07-13 DIAGNOSIS — K760 Fatty (change of) liver, not elsewhere classified: Secondary | ICD-10-CM | POA: Insufficient documentation

## 2023-07-13 DIAGNOSIS — R1310 Dysphagia, unspecified: Secondary | ICD-10-CM | POA: Insufficient documentation

## 2023-07-13 DIAGNOSIS — Z1211 Encounter for screening for malignant neoplasm of colon: Secondary | ICD-10-CM | POA: Insufficient documentation

## 2023-07-13 DIAGNOSIS — K648 Other hemorrhoids: Secondary | ICD-10-CM | POA: Insufficient documentation

## 2023-07-13 DIAGNOSIS — K3189 Other diseases of stomach and duodenum: Secondary | ICD-10-CM | POA: Insufficient documentation

## 2023-07-13 DIAGNOSIS — K209 Esophagitis, unspecified without bleeding: Secondary | ICD-10-CM

## 2023-07-13 DIAGNOSIS — R7303 Prediabetes: Secondary | ICD-10-CM | POA: Insufficient documentation

## 2023-07-13 DIAGNOSIS — K644 Residual hemorrhoidal skin tags: Secondary | ICD-10-CM | POA: Insufficient documentation

## 2023-07-13 DIAGNOSIS — K219 Gastro-esophageal reflux disease without esophagitis: Secondary | ICD-10-CM | POA: Insufficient documentation

## 2023-07-13 DIAGNOSIS — Z87891 Personal history of nicotine dependence: Secondary | ICD-10-CM | POA: Insufficient documentation

## 2023-07-13 DIAGNOSIS — F419 Anxiety disorder, unspecified: Secondary | ICD-10-CM | POA: Insufficient documentation

## 2023-07-13 DIAGNOSIS — K317 Polyp of stomach and duodenum: Secondary | ICD-10-CM | POA: Diagnosis not present

## 2023-07-13 DIAGNOSIS — R1013 Epigastric pain: Secondary | ICD-10-CM

## 2023-07-13 DIAGNOSIS — K2970 Gastritis, unspecified, without bleeding: Secondary | ICD-10-CM

## 2023-07-13 HISTORY — PX: ESOPHAGOGASTRODUODENOSCOPY (EGD) WITH PROPOFOL: SHX5813

## 2023-07-13 HISTORY — PX: POLYPECTOMY: SHX5525

## 2023-07-13 HISTORY — PX: BIOPSY: SHX5522

## 2023-07-13 HISTORY — DX: Other specified postprocedural states: Z98.890

## 2023-07-13 HISTORY — PX: HEMOSTASIS CLIP PLACEMENT: SHX6857

## 2023-07-13 HISTORY — PX: COLONOSCOPY WITH PROPOFOL: SHX5780

## 2023-07-13 SURGERY — COLONOSCOPY WITH PROPOFOL
Anesthesia: General

## 2023-07-13 MED ORDER — PHENYLEPHRINE 80 MCG/ML (10ML) SYRINGE FOR IV PUSH (FOR BLOOD PRESSURE SUPPORT)
PREFILLED_SYRINGE | INTRAVENOUS | Status: DC | PRN
  Administered 2023-07-13: 160 ug via INTRAVENOUS
  Administered 2023-07-13: 80 ug via INTRAVENOUS
  Administered 2023-07-13: 160 ug via INTRAVENOUS

## 2023-07-13 MED ORDER — GLYCOPYRROLATE PF 0.2 MG/ML IJ SOSY
PREFILLED_SYRINGE | INTRAMUSCULAR | Status: DC | PRN
Start: 2023-07-13 — End: 2023-07-13
  Administered 2023-07-13: .2 mg via INTRAVENOUS

## 2023-07-13 MED ORDER — PROPOFOL 500 MG/50ML IV EMUL
INTRAVENOUS | Status: DC | PRN
  Administered 2023-07-13: 150 ug/kg/min via INTRAVENOUS

## 2023-07-13 MED ORDER — GLYCOPYRROLATE PF 0.2 MG/ML IJ SOSY
PREFILLED_SYRINGE | INTRAMUSCULAR | Status: AC
  Filled 2023-07-13: qty 1

## 2023-07-13 MED ORDER — LIDOCAINE HCL (CARDIAC) PF 100 MG/5ML IV SOSY
PREFILLED_SYRINGE | INTRAVENOUS | Status: DC | PRN
  Administered 2023-07-13: 50 mg via INTRAVENOUS

## 2023-07-13 MED ORDER — STERILE WATER FOR IRRIGATION IR SOLN
Status: DC | PRN
  Administered 2023-07-13: 50 mL

## 2023-07-13 MED ORDER — LACTATED RINGERS IV SOLN: INTRAVENOUS | Status: DC | PRN

## 2023-07-13 MED ORDER — PHENYLEPHRINE 80 MCG/ML (10ML) SYRINGE FOR IV PUSH (FOR BLOOD PRESSURE SUPPORT)
PREFILLED_SYRINGE | INTRAVENOUS | Status: AC
  Filled 2023-07-13: qty 10

## 2023-07-13 MED ORDER — PROPOFOL 10 MG/ML IV BOLUS
INTRAVENOUS | Status: DC | PRN
  Administered 2023-07-13: 50 mg via INTRAVENOUS
  Administered 2023-07-13: 100 mg via INTRAVENOUS
  Administered 2023-07-13: 50 mg via INTRAVENOUS

## 2023-07-13 NOTE — Anesthesia Preprocedure Evaluation (Signed)
Anesthesia Evaluation  Patient identified by MRN, date of birth, ID band Patient awake    Reviewed: Allergy & Precautions, H&P , NPO status , Patient's Chart, lab work & pertinent test results, reviewed documented beta blocker date and time   History of Anesthesia Complications (+) PONV and history of anesthetic complications  Airway Mallampati: II  TM Distance: >3 FB Neck ROM: full    Dental no notable dental hx. (+) Dental Advisory Given, Teeth Intact   Pulmonary neg pulmonary ROS, former smoker   Pulmonary exam normal breath sounds clear to auscultation       Cardiovascular Exercise Tolerance: Good negative cardio ROS Normal cardiovascular exam Rhythm:regular Rate:Normal     Neuro/Psych   Anxiety     negative neurological ROS  negative psych ROS   GI/Hepatic hiatal hernia,GERD  ,,Fatty liver   Endo/Other  negative endocrine ROS    Renal/GU negative Renal ROS  negative genitourinary   Musculoskeletal   Abdominal   Peds  Hematology negative hematology ROS (+)   Anesthesia Other Findings   Reproductive/Obstetrics negative OB ROS                             Anesthesia Physical Anesthesia Plan  ASA: 2  Anesthesia Plan: General   Post-op Pain Management: Minimal or no pain anticipated   Induction: Intravenous  PONV Risk Score and Plan: Propofol infusion  Airway Management Planned: Nasal Cannula and Natural Airway  Additional Equipment: None  Intra-op Plan:   Post-operative Plan:   Informed Consent: I have reviewed the patients History and Physical, chart, labs and discussed the procedure including the risks, benefits and alternatives for the proposed anesthesia with the patient or authorized representative who has indicated his/her understanding and acceptance.     Dental Advisory Given  Plan Discussed with: CRNA  Anesthesia Plan Comments:        Anesthesia Quick  Evaluation

## 2023-07-13 NOTE — Anesthesia Postprocedure Evaluation (Signed)
Anesthesia Post Note  Patient: Lynn Mcdaniel  Procedure(s) Performed: COLONOSCOPY WITH PROPOFOL ESOPHAGOGASTRODUODENOSCOPY (EGD) WITH PROPOFOL BIOPSY POLYPECTOMY  Patient location during evaluation: PACU Anesthesia Type: General Level of consciousness: awake and alert Pain management: pain level controlled Vital Signs Assessment: post-procedure vital signs reviewed and stable Respiratory status: spontaneous breathing, nonlabored ventilation, respiratory function stable and patient connected to nasal cannula oxygen Cardiovascular status: blood pressure returned to baseline and stable Postop Assessment: no apparent nausea or vomiting Anesthetic complications: no   There were no known notable events for this encounter.   Last Vitals:  Vitals:   07/13/23 0925 07/13/23 1020  BP: (!) 156/69 (!) 95/40  Pulse: (!) 53 (!) 59  Resp: 17 15  Temp: 36.8 C (!) 36.4 C  SpO2: 96% 97%    Last Pain:  Vitals:   07/13/23 1021  TempSrc:   PainSc: 0-No pain                 Chrystian Cupples L Armetta Henri

## 2023-07-13 NOTE — Interval H&P Note (Signed)
History and Physical Interval Note:  07/13/2023 9:17 AM  Lynn Mcdaniel  has presented today for surgery, with the diagnosis of SCREENING, DYSPHAGIA.  The various methods of treatment have been discussed with the patient and family. After consideration of risks, benefits and other options for treatment, the patient has consented to  Procedure(s) with comments: COLONOSCOPY WITH PROPOFOL (N/A) - 10:30AM;ASA 1-2 ESOPHAGOGASTRODUODENOSCOPY (EGD) WITH PROPOFOL (N/A) - 10:30AM;ASA 1-2 as a surgical intervention.  The patient's history has been reviewed, patient examined, no change in status, stable for surgery.  I have reviewed the patient's chart and labs.  Questions were answered to the patient's satisfaction.     Juanetta Beets Ardell Makarewicz

## 2023-07-13 NOTE — Progress Notes (Signed)
Spoke to the patient in person  Suspected hepatic steatosis.  5 mm nonobstructing interpolar right renal calculus. No hydronephrosis.  ULTRASOUND HEPATIC ELASTOGRAPHY: Median kPa:  4.6  CBD: 3mm , No gallstones and no significant fibrosis reported   Follow up with PCP /urology for renal stone

## 2023-07-13 NOTE — Transfer of Care (Signed)
Immediate Anesthesia Transfer of Care Note  Patient: Lynn Mcdaniel  Procedure(s) Performed: COLONOSCOPY WITH PROPOFOL ESOPHAGOGASTRODUODENOSCOPY (EGD) WITH PROPOFOL BIOPSY POLYPECTOMY  Patient Location: Endoscopy Unit  Anesthesia Type:General  Level of Consciousness: drowsy  Airway & Oxygen Therapy: Patient Spontanous Breathing  Post-op Assessment: Report given to RN and Post -op Vital signs reviewed and stable  Post vital signs: Reviewed and stable  Last Vitals:  Vitals Value Taken Time  BP    Temp    Pulse    Resp    SpO2      Last Pain:  Vitals:   07/13/23 0937  TempSrc:   PainSc: 0-No pain      Patients Stated Pain Goal: 6 (07/13/23 0925)  Complications: No notable events documented.

## 2023-07-13 NOTE — Op Note (Signed)
[facility] Patient Name: Lynn Mcdaniel Procedure Date: 07/13/2023 8:52 AM MRN: [id number] Date of Birth: 16-May-1971 Attending MD: Sanjuan Dame , MD, [id number] CSN: [id number] Age: 52 Admit Type: Outpatient Procedure:                Upper GI endoscopy Indications:              Epigastric abdominal pain, Dysphagia Providers:                Sanjuan Dame, MD, Buel Ream. Thomasena Edis RN, RN,                            Francoise Ceo RN, RN, Dyann Ruddle Referring MD:             Sanjuan Dame, MD Medicines:                Monitored Anesthesia Care Complications:            No immediate complications. Estimated Blood Loss:     Estimated blood loss was minimal. Procedure:                Pre-Anesthesia Assessment:                           - Prior to the procedure, a History and Physical                            was performed, and patient medications and                            allergies were reviewed. The patient's tolerance of                            previous anesthesia was also reviewed. The risks                            and benefits of the procedure and the sedation                            options and risks were discussed with the patient.                            All questions were answered, and informed consent                            was obtained. Prior Anticoagulants: The patient has                            taken no anticoagulant or antiplatelet agents                            except for aspirin. ASA Grade Assessment: II - A                            patient with mild systemic disease. After reviewing  the risks and benefits, the patient was deemed in                            satisfactory condition to undergo the procedure.                           After obtaining informed consent, the endoscope was                            passed under direct vision. Throughout the                            procedure, the patient's blood  pressure, pulse, and                            oxygen saturations were monitored continuously. The                            [id number]-[id number] ([id number]) scope was introduced through the                            mouth, and advanced to the second part of duodenum.                            The upper GI endoscopy was accomplished with ease.                            The patient tolerated the procedure well. Scope In: 9:38:23 AM Scope Out: 9:55:20 AM Total Procedure Duration: 0 hours 16 minutes 57 seconds  Findings:      Esophagogastric landmarks were identified: the Z-line was found at 35 cm       and the gastroesophageal junction was found at 37 cm from the incisors.      No endoscopic abnormality was evident in the esophagus to explain the       patient's complaint of dysphagia. It was decided, however, to proceed       with dilation of the entire esophagus. A guidewire was placed and the       scope was withdrawn. Dilation was performed with a Savary dilator with       no resistance at 16 mm. The dilation site was examined following       endoscope reinsertion and showed mild mucosal disruption. Biopsies were       obtained from the proximal and distal esophagus with cold forceps for       histology of suspected eosinophilic esophagitis.      Multiple 2 to 15 mm sessile polyps with no bleeding and no stigmata of       recent bleeding were found in the entire examined stomach. The polyp was       removed with a cold snare. Resection and retrieval were complete. For       hemostasis, one hemostatic clip was successfully placed (MR       conditional). Clip manufacturer: [employer]. There was no       bleeding at the end of the procedure.      Mildly erythematous mucosa without bleeding was  found in the gastric       antrum. Biopsies were taken with a cold forceps for histology.      The duodenal bulb and second portion of the duodenum were normal.       Biopsies were taken with a cold  forceps for histology. Impression:               - Esophagogastric landmarks identified.                           - No endoscopic esophageal abnormality to explain                            patient's dysphagia. Esophagus dilated. Dilated.                           - Multiple gastric polyps. 15mm polyp Resected and                            retrieved. Clip (MR conditional) was placed. Clip                            manufacturer: [employer].                           - Erythematous mucosa in the antrum. Biopsied.                           - Normal duodenal bulb and second portion of the                            duodenum. Biopsied.                           - Biopsies were taken with a cold forceps for                            evaluation of eosinophilic esophagitis. Moderate Sedation:      Per Anesthesia Care Recommendation:           - Patient has a contact number available for                            emergencies. The signs and symptoms of potential                            delayed complications were discussed with the                            patient. Return to normal activities tomorrow.                            Written discharge instructions were provided to the                            patient.                           -  Resume previous diet.                           - Continue present medications.                           - Await pathology results. Procedure Code(s):        --- Professional ---                           [phone], Esophagogastroduodenoscopy, flexible,                            transoral; with removal of tumor(s), polyp(s), or                            other lesion(s) by snare technique                           43248, Esophagogastroduodenoscopy, flexible,                            transoral; with insertion of guide wire followed by                            passage of dilator(s) through esophagus over guide                             wire                           43239, 59, Esophagogastroduodenoscopy, flexible,                            transoral; with biopsy, single or multiple Diagnosis Code(s):        --- Professional ---                           R13.10, Dysphagia, unspecified                           K31.7, Polyp of stomach and duodenum                           K31.89, Other diseases of stomach and duodenum                           R10.13, Epigastric pain CPT copyright 2022 American Medical Association. All rights reserved. The codes documented in this report are preliminary and upon coder review may  be revised to meet current compliance requirements. Sanjuan Dame, MD Sanjuan Dame, MD 07/13/2023 10:20:38 AM This report has been signed electronically. Number of Addenda: 0

## 2023-07-13 NOTE — Telephone Encounter (Signed)
Spoke to the patient in person today

## 2023-07-13 NOTE — Discharge Instructions (Signed)

## 2023-07-13 NOTE — Op Note (Signed)
[facility] Patient Name: Lynn Mcdaniel Procedure Date: 07/13/2023 8:50 AM MRN: [id number] Date of Birth: 1970-09-26 Attending MD: Sanjuan Dame , MD, [id number] CSN: [id number] Age: 52 Admit Type: Outpatient Procedure:                Colonoscopy Indications:              Screening for colorectal malignant neoplasm Providers:                Sanjuan Dame, MD, Buel Ream. Thomasena Edis RN, RN,                            Francoise Ceo RN, RN, Dyann Ruddle Referring MD:             Sanjuan Dame, MD Medicines:                Monitored Anesthesia Care Complications:            No immediate complications. Estimated Blood Loss:     Estimated blood loss: none. Procedure:                Pre-Anesthesia Assessment:                           - Prior to the procedure, a History and Physical                            was performed, and patient medications and                            allergies were reviewed. The patient's tolerance of                            previous anesthesia was also reviewed. The risks                            and benefits of the procedure and the sedation                            options and risks were discussed with the patient.                            All questions were answered, and informed consent                            was obtained. Prior Anticoagulants: The patient has                            taken no anticoagulant or antiplatelet agents. ASA                            Grade Assessment: II - A patient with mild systemic                            disease. After reviewing the risks and benefits,  the patient was deemed in satisfactory condition to                            undergo the procedure.                           After obtaining informed consent, the colonoscope                            was passed under direct vision. Throughout the                            procedure, the patient's blood pressure, pulse, and                             oxygen saturations were monitored continuously. The                            [id number]-[id number] ([id number]) scope was introduced through                            the anus and advanced to the the cecum, identified                            by appendiceal orifice and ileocecal valve. The                            colonoscopy was performed without difficulty. The                            patient tolerated the procedure well. The quality                            of the bowel preparation was evaluated using the                            BBPS [facility] Bowel Preparation Scale) with scores                            of: Right Colon = 3, Transverse Colon = 3 and Left                            Colon = 3 (entire mucosa seen well with no residual                            staining, small fragments of stool or opaque                            liquid). The total BBPS score equals 9. The                            ileocecal valve, appendiceal orifice, and rectum  were photographed. Scope In: 10:00:35 AM Scope Out: 10:15:51 AM Scope Withdrawal Time: 0 hours 9 minutes 59 seconds  Total Procedure Duration: 0 hours 15 minutes 16 seconds  Findings:      The perianal and digital rectal examinations were normal.      Non-bleeding external and internal hemorrhoids were found during       retroflexion. The hemorrhoids were small.      The exam was otherwise without abnormality. Impression:               - Non-bleeding external and internal hemorrhoids.                           - The examination was otherwise normal.                           - No specimens collected. Moderate Sedation:      Per Anesthesia Care Recommendation:           - Patient has a contact number available for                            emergencies. The signs and symptoms of potential                            delayed complications were discussed with the                             patient. Return to normal activities tomorrow.                            Written discharge instructions were provided to the                            patient.                           - Resume previous diet.                           - Continue present medications.                           - Repeat colonoscopy in 10 years for screening                            purposes.                           - Return to GI office as previously scheduled. Procedure Code(s):        --- Professional ---                           U4403, Colorectal cancer screening; colonoscopy on                            individual not meeting criteria for high risk Diagnosis Code(s):        --- Professional ---  Z12.11, Encounter for screening for malignant                            neoplasm of colon                           K64.8, Other hemorrhoids CPT copyright 2022 American Medical Association. All rights reserved. The codes documented in this report are preliminary and upon coder review may  be revised to meet current compliance requirements. Sanjuan Dame, MD Sanjuan Dame, MD 07/13/2023 10:22:57 AM This report has been signed electronically. Number of Addenda: 0

## 2023-07-13 NOTE — Anesthesia Procedure Notes (Signed)
Date/Time: 07/13/2023 9:39 AM  Performed by: Julian Reil, CRNAPre-anesthesia Checklist: Patient identified, Emergency Drugs available, Suction available and Patient being monitored Patient Re-evaluated:Patient Re-evaluated prior to induction Oxygen Delivery Method: Nasal cannula Induction Type: IV induction Placement Confirmation: positive ETCO2

## 2023-07-14 ENCOUNTER — Telehealth (INDEPENDENT_AMBULATORY_CARE_PROVIDER_SITE_OTHER): Payer: Self-pay | Admitting: Gastroenterology

## 2023-07-14 NOTE — Telephone Encounter (Signed)
Yes please she can have a note for yesterday

## 2023-07-14 NOTE — Telephone Encounter (Signed)
Pt left voicemail needing a work note stating she had a colonoscopy yesterday. Can we get this faxed to patient? Fax number [phone]. Please advise. Thank you

## 2023-07-14 NOTE — Telephone Encounter (Signed)
Please provide note for pt

## 2023-07-15 ENCOUNTER — Encounter (INDEPENDENT_AMBULATORY_CARE_PROVIDER_SITE_OTHER): Payer: Self-pay | Admitting: Gastroenterology

## 2023-07-15 LAB — SURGICAL PATHOLOGY

## 2023-07-16 NOTE — Progress Notes (Signed)
I reviewed the pathology results. Ann, can you send her a letter with the findings as described below please? Repeat colonoscopy in 10 years  Thanks,  Vista Lawman, MD Gastroenterology and Hepatology [facility]  ---------------------------------------------------------------------------------------------  [facility] [address], [address], [place], [place] [address] Phone:  [phone]   07/16/23 [place], [place]   Dear Lynn Mcdaniel,  I am writing to inform you that the biopsies taken during your recent endoscopic examination showed:  No H. Pylori bacteria in stomach , or any early cancer changes to the stomach mucosa ( Intestinal metaplasia)  Normal biopsies of the food-pipe ( no eosinophilic esophagitis )  Normal biopsy of small bowel The polyp we removed in the stomach is fundic gland polyp which is benign and nothing to be worried about  At this time I recommend repeat colonoscopy in 10 years  Please call us at [phone] if you have persistent problems or have questions about your condition that have not been fully answered at this time.  Sincerely,  Vista Lawman, MD Gastroenterology and Hepatology

## 2023-07-20 ENCOUNTER — Encounter (HOSPITAL_COMMUNITY): Payer: Self-pay | Admitting: Gastroenterology

## 2023-07-20 ENCOUNTER — Encounter (INDEPENDENT_AMBULATORY_CARE_PROVIDER_SITE_OTHER): Payer: Self-pay | Admitting: *Deleted

## 2024-01-31 ENCOUNTER — Encounter (INDEPENDENT_AMBULATORY_CARE_PROVIDER_SITE_OTHER): Payer: Self-pay | Admitting: Gastroenterology

## 2024-01-31 ENCOUNTER — Ambulatory Visit (INDEPENDENT_AMBULATORY_CARE_PROVIDER_SITE_OTHER): Admitting: Gastroenterology

## 2024-01-31 VITALS — BP 122/72 | HR 45 | Temp 98.1°F | Ht 64.0 in | Wt 165.4 lb

## 2024-01-31 DIAGNOSIS — K219 Gastro-esophageal reflux disease without esophagitis: Secondary | ICD-10-CM | POA: Diagnosis not present

## 2024-01-31 DIAGNOSIS — R197 Diarrhea, unspecified: Secondary | ICD-10-CM | POA: Insufficient documentation

## 2024-01-31 NOTE — Patient Instructions (Signed)
 You can continue pepcid 20mg  daily for your reflux I suspect diarrhea may be secondary to stopping your omeprazole If you have return of diarrhea, please let me know and we can do some further testing Continue probiotic/daily yogurt You may consider starting benefiber 1-2 Tablespoons per day as this can help with both constipation and diarrhea Make sure water  intake is good, diet is high in fruits, veggies, whole grains  Follow up 6 months

## 2024-01-31 NOTE — Progress Notes (Signed)
 Referring Provider: Tressie Fryer, MD Primary Care Physician:  Tressie Fryer, MD Primary GI Physician: Dr. Alita Irwin   Chief Complaint  Patient presents with   Diarrhea    Pt arrives due to having diarrhea for about 4 weeks. Diarrhea was not everyday-had it at least 4 days a week. Pt is now having constipation. Pt was diagnosed with IBS several years but has not had it with diarrhea before. Pt has bloating and pain when pushing on stomach. No nausea/vomiting/fever. Pt states not sure if Omeprazole caused a part in the diarrhea.    HPI:   Lynn Mcdaniel is a 53 y.o. female with past medical history of HTN, HLD  Patient presenting today for:  Diarrhea GERD  Last seen October 2024, at that time having RUQ pain, alternating BMs, sometimes straining. Concerned about fatty liver  Recommended EGD, Colonoscopy, consider HIDA if RUQ pain persists   Present:  States she stopped taking omeprazole in march because she was concerned about side effects and had been on this for about 15 years. She notes about 3 weeks after she stopped PPI, she began pepcid and then started having diarrhea for about 4 weeks. She stopped the pepcid for a few weeks thinking it may have caused diarrhea. She resumed this after resolution of diarrhea. She would have painless diarrhea with urgency, water  stools, upwards of 6-7 stools per day. She started taking imodium after she had a few BMs which would help usually into the next day then she would repeat Imodium dose. She notes she then went a week without a BM and then had a very large, hard, stool. She had more normal stools following that until last Tuesday when she had return of diarrhea, took imodium and did not have a BM until Saturday when stools were hard.  She notes one day last week she felt bloated with some abdominal discomfort. Denies rectal bleeding or melena. Reports she kept a stool/food diary but could not pinpoint any specific factors that seemed to affect  her stooling.  At baseline, she reports mostly regular stooling for the past 6 months to 1 year,  though does report history of IBS with constipation. She did start some probiotic yogurt which she thinks has helped some.    Elastography; 05/2024 kpa 4.6, 5mm non obstructing renal stone, no gallstones  Last Colonoscopy:07/2023 - Non-bleeding external and internal hemorrhoids.                           - The examination was otherwise normal.                           - No specimens collected. Last Endoscopy:07/2023  Esophagogastric landmarks identified.                           - No endoscopic esophageal abnormality to explain                            patient's dysphagia. Esophagus dilated. Dilated.                           - Multiple gastric polyps. 15mm polyp Resected and  retrieved. Clip (MR conditional) was placed. Clip                            manufacturer: [employer].                           - Erythematous mucosa in the antrum. Biopsied.                           - Normal duodenal bulb and second portion of the                            duodenum. Biopsied.                           - Biopsies were taken with a cold forceps for                            evaluation of eosinophilic esophagitis.  A. Benign small bowel mucosa with no significant pathologic changes   B. STOMACH, BIOPSY:  - Mild reactive gastropathy.  - Negative for H. pylori on HE stain  - No intestinal metaplasia, dysplasia, or malignancy.   C. STOMACH, POLYPECTOMY:  - Fundic gland polyps   D. ESOPHAGUS, BIOPSY:  - Benign squamous mucosa and gastric cardiac mucosa with mild chronic  inflammation  - Negative for increased intraepithelial eosinophils  - Negative for intestinal metaplasia, dysplasia or malignancy   TCS 10 year recall  [facility] Weights   01/31/24 0845  Weight: 165 lb 6.4 oz (75 kg)     Past Medical History:  Diagnosis Date   Anxiety state 06/03/2017    Fatty liver 06/03/2017   GERD (gastroesophageal reflux disease) 06/03/2017   Hiatal hernia 06/03/2017   High cholesterol 06/03/2017   PONV (postoperative nausea and vomiting)     Past Surgical History:  Procedure Laterality Date   APPENDECTOMY     BIOPSY  07/13/2023   Procedure: BIOPSY;  Surgeon: Hargis Lias, MD;  [facility]: [facility];  Service: Endoscopy;;   CESAREAN SECTION     COLONOSCOPY WITH PROPOFOL  N/A 07/13/2023   Procedure: COLONOSCOPY WITH PROPOFOL ;  Surgeon: Hargis Lias, MD;  Location: [facility];  Service: Endoscopy;  Laterality: N/A;  10:30AM;ASA 1-2   ESOPHAGOGASTRODUODENOSCOPY (EGD) WITH PROPOFOL  N/A 07/13/2023   Procedure: ESOPHAGOGASTRODUODENOSCOPY (EGD) WITH PROPOFOL ;  Surgeon: Hargis Lias, MD;  Location: [facility];  Service: Endoscopy;  Laterality: N/A;  10:30AM;ASA 1-2   HEMOSTASIS CLIP PLACEMENT  07/13/2023   Procedure: HEMOSTASIS CLIP PLACEMENT;  Surgeon: Hargis Lias, MD;  [facility]: [facility];  Service: Endoscopy;;   partial hysterectomy     POLYPECTOMY  07/13/2023   Procedure: POLYPECTOMY;  Surgeon: Hargis Lias, MD;  [facility]: [facility];  Service: Endoscopy;;   TONSILLECTOMY      Current Outpatient Medications  Medication Sig Dispense Refill   ALPRAZolam (XANAX) 0.5 MG tablet Take 0.5 mg by mouth at bedtime as needed for anxiety.     Calcium Carbonate-Vit D-Min (CALCIUM 1200 PO) Take by mouth.     cholecalciferol (VITAMIN D) 1000 units tablet Take 4,000 Units by mouth daily.     famotidine (PEPCID) 20 MG tablet Take 20 mg by mouth 2 (two) times daily.  losartan (COZAAR) 50 MG tablet Take 50 mg by mouth daily.     nebivolol (BYSTOLIC) 5 MG tablet Take 5 mg by mouth daily.     OVER THE COUNTER MEDICATION Fish oil 2 per day     sertraline (ZOLOFT) 100 MG tablet Take 100 mg by mouth daily.     No current facility-administered medications for this visit.    Allergies as of 01/31/2024   (No Known Allergies)     Social History   Socioeconomic History   Marital status: Married    Spouse name: Not on file   Number of children: Not on file   Years of education: Not on file   Highest education level: Not on file  Occupational History   Not on file  Tobacco Use   Smoking status: Former    Passive exposure: Past   Smokeless tobacco: Never   Tobacco comments:    quit 10 yrs ago after smoking for 20 yrs. 1 pack a day.   Vaping Use   Vaping status: Never Used  Substance and Sexual Activity   Alcohol use: No   Drug use: No   Sexual activity: Not on file  Other Topics Concern   Not on file  Social History Narrative   Not on file   Social Drivers of Health   Financial Resource Strain: Not on file  Food Insecurity: Not on file  Transportation Needs: Not on file  Physical Activity: Not on file  Stress: Not on file  Social Connections: Not on file    Review of systems General: negative for malaise, night sweats, fever, chills, weight loss Neck: Negative for lumps, goiter, pain and significant neck swelling Resp: Negative for cough, wheezing, dyspnea at rest CV: Negative for chest pain, leg swelling, palpitations, orthopnea GI: denies melena, hematochezia, nausea, vomiting, dysphagia, odyonophagia, early satiety or unintentional weight loss. +diarrhea +constipation  MSK: Negative for joint pain or swelling, back pain, and muscle pain. Derm: Negative for itching or rash Psych: Denies depression, anxiety, memory loss, confusion. No homicidal or suicidal ideation.  Heme: Negative for prolonged bleeding, bruising easily, and swollen nodes. Endocrine: Negative for cold or heat intolerance, polyuria, polydipsia and goiter. Neuro: negative for tremor, gait imbalance, syncope and seizures. The remainder of the review of systems is noncontributory.  Physical Exam: BP 122/72   Pulse (!) 45   Temp 98.1 F (36.7 C)   Ht 5\' 4"  (1.626 m)   Wt 165 lb 6.4 oz (75 kg)   BMI 28.39 kg/m   General:   Alert and oriented. No distress noted. Pleasant and cooperative.  Head:  Normocephalic and atraumatic. Eyes:  Conjuctiva clear without scleral icterus. Mouth:  Oral mucosa pink and moist. Good dentition. No lesions. Heart: Normal rate and rhythm, s1 and s2 heart sounds present.  Lungs: Clear lung sounds in all lobes. Respirations equal and unlabored. Abdomen:  +BS, soft, non-tender and non-distended. No rebound or guarding. No HSM or masses noted. Derm: No palmar erythema or jaundice Msk:  Symmetrical without gross deformities. Normal posture. Extremities:  Without edema. Neurologic:  Alert and  oriented x4 Psych:  Alert and cooperative. Normal mood and affect.  Invalid input(s): "6 MONTHS"   ASSESSMENT: LETISHIA ELLIOTT is a 53 y.o. female presenting today for diarrhea and GERD  New onset diarrhea: Watery stools and urgency after stopping omeprazole, no associated abdominal pain, nausea, vomiting, rectal bleeding or melena. History of IBS-C, denies any feelings of constipation prior to onset, was moving bowels  regularly. Differentials include rebound diarrhea from stopping PPI, overflow diarrhea, though less likely given regular BMs prior to onset, low suspicion for infectious etiology. No celiac disease on recent biopsies, no recent tick bites/antibiotics.  Reassuringly recent colonoscopy/EGD as outlined above was grossly unremarkable. Diarrhea seems to have stopped, if she has recurrence she should make me aware and we will perform stool studies to rule out other causes.   GERD: previously on omeprazole, now taking pepcid 20mg  daily which she feels is controlling symptoms. She inquired about safety of PPI, and was re-assured regarding PPI use and safety of when appropriate indications in question. Most recent studies on PPI therapy that association of symptoms is not equivalent to causation and overall association with for example osteoporosis is weak and based on observational  studies. When PPI use is indicated, it is safe to proceed with therapy and titrate dosing/use based on symptom response. For now, can continue with H2B given symptoms are controlled.   Patient did inquire about presence of hiatal hernia on most recent EGD as she notes it was mentioned after the procedure, she has history of the same, but no mention in the op note, I will inquire with Dr. Alita Irwin regarding this as he performed her procedure.    PLAN:  -continue pepcid 20mg   -good reflux precautions  -stool testing if diarrhea recurs -can start benefiber 1-2T daily -continue probiotic yogurt  -Increase water  intake, aim for atleast 64 oz per day -Increase fruits, veggies and whole grains, kiwi and prunes are especially good for constipation -discuss HH with Dr. Alita Irwin   All questions were answered, patient verbalized understanding and is in agreement with plan as outlined above.   Follow Up: 6 months   Rodel Glaspy L. Rebekkah Powless, MSN, APRN, AGNP-C Adult-Gerontology Nurse Practitioner [facility]

## 2024-07-06 ENCOUNTER — Encounter (INDEPENDENT_AMBULATORY_CARE_PROVIDER_SITE_OTHER): Payer: Self-pay | Admitting: Gastroenterology
